# Patient Record
Sex: Female | Born: 1965 | Race: White | Hispanic: No | Marital: Married | Smoking: Never smoker
Health system: Southern US, Community
[De-identification: ages and names within clinical notes are randomized; demographics above are authoritative.]

## PROBLEM LIST (undated history)

## (undated) DIAGNOSIS — F988 Other specified behavioral and emotional disorders with onset usually occurring in childhood and adolescence: Secondary | ICD-10-CM

## (undated) HISTORY — DX: Other specified behavioral and emotional disorders with onset usually occurring in childhood and adolescence: F98.8

## (undated) HISTORY — PX: KNEE SURGERY: SHX244

---

## 1997-09-18 ENCOUNTER — Encounter (HOSPITAL_COMMUNITY): Admission: RE | Admit: 1997-09-18 | Discharge: 1997-11-11 | Payer: Self-pay | Admitting: *Deleted

## 1997-10-08 ENCOUNTER — Ambulatory Visit (HOSPITAL_COMMUNITY): Admission: RE | Admit: 1997-10-08 | Discharge: 1997-10-08 | Payer: Self-pay | Admitting: *Deleted

## 1997-10-21 ENCOUNTER — Observation Stay (HOSPITAL_COMMUNITY): Admission: AD | Admit: 1997-10-21 | Discharge: 1997-10-22 | Payer: Self-pay | Admitting: *Deleted

## 1997-11-10 ENCOUNTER — Inpatient Hospital Stay (HOSPITAL_COMMUNITY): Admission: AD | Admit: 1997-11-10 | Discharge: 1997-11-12 | Payer: Self-pay | Admitting: *Deleted

## 1998-01-14 ENCOUNTER — Inpatient Hospital Stay (HOSPITAL_COMMUNITY): Admission: RE | Admit: 1998-01-14 | Discharge: 1998-01-14 | Payer: Self-pay | Admitting: *Deleted

## 1998-01-17 ENCOUNTER — Encounter: Admission: RE | Admit: 1998-01-17 | Discharge: 1998-04-17 | Payer: Self-pay | Admitting: *Deleted

## 1999-03-24 ENCOUNTER — Inpatient Hospital Stay (HOSPITAL_COMMUNITY): Admission: AD | Admit: 1999-03-24 | Discharge: 1999-03-24 | Payer: Self-pay | Admitting: *Deleted

## 2000-07-05 ENCOUNTER — Inpatient Hospital Stay (HOSPITAL_COMMUNITY): Admission: AD | Admit: 2000-07-05 | Discharge: 2000-07-05 | Payer: Self-pay | Admitting: *Deleted

## 2000-07-19 ENCOUNTER — Inpatient Hospital Stay (HOSPITAL_COMMUNITY): Admission: RE | Admit: 2000-07-19 | Discharge: 2000-07-19 | Payer: Self-pay | Admitting: *Deleted

## 2000-07-19 ENCOUNTER — Encounter: Payer: Self-pay | Admitting: *Deleted

## 2000-07-29 ENCOUNTER — Encounter (HOSPITAL_COMMUNITY): Admission: RE | Admit: 2000-07-29 | Discharge: 2000-10-27 | Payer: Self-pay | Admitting: *Deleted

## 2000-08-05 ENCOUNTER — Observation Stay (HOSPITAL_COMMUNITY): Admission: RE | Admit: 2000-08-05 | Discharge: 2000-08-06 | Payer: Self-pay | Admitting: *Deleted

## 2000-10-04 ENCOUNTER — Ambulatory Visit (HOSPITAL_COMMUNITY): Admission: RE | Admit: 2000-10-04 | Discharge: 2000-10-04 | Payer: Self-pay | Admitting: *Deleted

## 2000-10-04 ENCOUNTER — Encounter: Payer: Self-pay | Admitting: *Deleted

## 2000-10-10 ENCOUNTER — Ambulatory Visit (HOSPITAL_COMMUNITY): Admission: RE | Admit: 2000-10-10 | Discharge: 2000-10-10 | Payer: Self-pay | Admitting: *Deleted

## 2000-11-01 ENCOUNTER — Encounter (HOSPITAL_COMMUNITY): Admission: RE | Admit: 2000-11-01 | Discharge: 2000-12-14 | Payer: Self-pay | Admitting: *Deleted

## 2000-11-07 ENCOUNTER — Encounter: Payer: Self-pay | Admitting: *Deleted

## 2000-12-13 ENCOUNTER — Encounter: Payer: Self-pay | Admitting: *Deleted

## 2000-12-25 ENCOUNTER — Inpatient Hospital Stay (HOSPITAL_COMMUNITY): Admission: AD | Admit: 2000-12-25 | Discharge: 2000-12-25 | Payer: Self-pay | Admitting: Obstetrics & Gynecology

## 2000-12-26 ENCOUNTER — Inpatient Hospital Stay (HOSPITAL_COMMUNITY): Admission: AD | Admit: 2000-12-26 | Discharge: 2000-12-26 | Payer: Self-pay | Admitting: Obstetrics & Gynecology

## 2001-01-03 ENCOUNTER — Encounter: Payer: Self-pay | Admitting: *Deleted

## 2001-01-03 ENCOUNTER — Encounter (HOSPITAL_COMMUNITY): Admission: RE | Admit: 2001-01-03 | Discharge: 2001-02-02 | Payer: Self-pay | Admitting: Obstetrics and Gynecology

## 2001-01-21 ENCOUNTER — Inpatient Hospital Stay (HOSPITAL_COMMUNITY): Admission: AD | Admit: 2001-01-21 | Discharge: 2001-01-21 | Payer: Self-pay | Admitting: Obstetrics

## 2001-01-24 ENCOUNTER — Encounter: Payer: Self-pay | Admitting: *Deleted

## 2001-01-24 ENCOUNTER — Inpatient Hospital Stay (HOSPITAL_COMMUNITY): Admission: AD | Admit: 2001-01-24 | Discharge: 2001-01-27 | Payer: Self-pay | Admitting: *Deleted

## 2001-02-07 ENCOUNTER — Encounter (HOSPITAL_COMMUNITY): Admission: RE | Admit: 2001-02-07 | Discharge: 2001-02-27 | Payer: Self-pay | Admitting: *Deleted

## 2001-02-07 ENCOUNTER — Encounter: Payer: Self-pay | Admitting: *Deleted

## 2001-02-13 ENCOUNTER — Observation Stay (HOSPITAL_COMMUNITY): Admission: AD | Admit: 2001-02-13 | Discharge: 2001-02-13 | Payer: Self-pay | Admitting: Obstetrics

## 2001-02-24 ENCOUNTER — Inpatient Hospital Stay (HOSPITAL_COMMUNITY): Admission: RE | Admit: 2001-02-24 | Discharge: 2001-02-26 | Payer: Self-pay | Admitting: *Deleted

## 2001-02-24 ENCOUNTER — Encounter: Payer: Self-pay | Admitting: *Deleted

## 2001-02-28 ENCOUNTER — Inpatient Hospital Stay (HOSPITAL_COMMUNITY): Admission: AD | Admit: 2001-02-28 | Discharge: 2001-02-28 | Payer: Self-pay | Admitting: Obstetrics

## 2001-03-07 ENCOUNTER — Inpatient Hospital Stay (HOSPITAL_COMMUNITY): Admission: AD | Admit: 2001-03-07 | Discharge: 2001-03-07 | Payer: Self-pay | Admitting: *Deleted

## 2002-03-06 ENCOUNTER — Inpatient Hospital Stay (HOSPITAL_COMMUNITY): Admission: AD | Admit: 2002-03-06 | Discharge: 2002-03-06 | Payer: Self-pay | Admitting: *Deleted

## 2002-06-01 ENCOUNTER — Ambulatory Visit (HOSPITAL_COMMUNITY): Admission: RE | Admit: 2002-06-01 | Discharge: 2002-06-01 | Payer: Self-pay | Admitting: *Deleted

## 2002-06-01 ENCOUNTER — Encounter: Payer: Self-pay | Admitting: *Deleted

## 2003-06-18 ENCOUNTER — Ambulatory Visit (HOSPITAL_COMMUNITY): Admission: RE | Admit: 2003-06-18 | Discharge: 2003-06-18 | Payer: Self-pay | Admitting: *Deleted

## 2003-06-18 ENCOUNTER — Encounter: Admission: RE | Admit: 2003-06-18 | Discharge: 2003-06-18 | Payer: Self-pay | Admitting: *Deleted

## 2003-06-27 ENCOUNTER — Encounter: Admission: RE | Admit: 2003-06-27 | Discharge: 2003-06-27 | Payer: Self-pay | Admitting: *Deleted

## 2005-12-10 ENCOUNTER — Ambulatory Visit: Payer: Self-pay | Admitting: Internal Medicine

## 2006-04-29 ENCOUNTER — Ambulatory Visit: Payer: Self-pay | Admitting: Internal Medicine

## 2010-12-28 ENCOUNTER — Ambulatory Visit: Payer: Self-pay | Admitting: Internal Medicine

## 2011-01-06 ENCOUNTER — Ambulatory Visit: Payer: Self-pay | Admitting: Internal Medicine

## 2012-08-02 ENCOUNTER — Ambulatory Visit: Payer: Self-pay | Admitting: Podiatry

## 2012-11-07 ENCOUNTER — Ambulatory Visit: Payer: Self-pay | Admitting: Internal Medicine

## 2013-12-22 ENCOUNTER — Emergency Department: Payer: Self-pay | Admitting: Emergency Medicine

## 2013-12-22 LAB — COMPREHENSIVE METABOLIC PANEL
Albumin: 3.7 g/dL (ref 3.4–5.0)
Alkaline Phosphatase: 71 U/L
Anion Gap: 5 — ABNORMAL LOW (ref 7–16)
BUN: 14 mg/dL (ref 7–18)
Bilirubin,Total: 0.3 mg/dL (ref 0.2–1.0)
Calcium, Total: 8.5 mg/dL (ref 8.5–10.1)
Chloride: 105 mmol/L (ref 98–107)
Co2: 28 mmol/L (ref 21–32)
Creatinine: 0.71 mg/dL (ref 0.60–1.30)
EGFR (African American): >60
EGFR (Non-African Amer.): >60
Glucose: 107 mg/dL — ABNORMAL HIGH (ref 65–99)
Osmolality: 277 (ref 275–301)
Potassium: 4 mmol/L (ref 3.5–5.1)
SGOT(AST): 13 U/L — ABNORMAL LOW (ref 15–37)
SGPT (ALT): 21 U/L (ref 12–78)
Sodium: 138 mmol/L (ref 136–145)
Total Protein: 7.3 g/dL (ref 6.4–8.2)

## 2013-12-22 LAB — URINALYSIS, COMPLETE: Specific Gravity: 1.023 (ref 1.003–1.030)

## 2013-12-22 LAB — CBC
HCT: 38.7 % (ref 35.0–47.0)
HGB: 12.7 g/dL (ref 12.0–16.0)
MCH: 29.5 pg (ref 26.0–34.0)
MCHC: 32.7 g/dL (ref 32.0–36.0)
MCV: 90 fL (ref 80–100)
Platelet: 314 10*3/uL (ref 150–440)
RBC: 4.29 10*6/uL (ref 3.80–5.20)
RDW: 13.6 % (ref 11.5–14.5)
WBC: 8.9 10*3/uL (ref 3.6–11.0)

## 2013-12-22 LAB — PREGNANCY, URINE: Pregnancy Test, Urine: NEGATIVE m[IU]/mL

## 2013-12-26 DIAGNOSIS — F909 Attention-deficit hyperactivity disorder, unspecified type: Secondary | ICD-10-CM | POA: Insufficient documentation

## 2013-12-28 ENCOUNTER — Ambulatory Visit: Payer: Self-pay | Admitting: Internal Medicine

## 2015-03-12 ENCOUNTER — Emergency Department (HOSPITAL_COMMUNITY)
Admission: EM | Admit: 2015-03-12 | Discharge: 2015-03-12 | Disposition: A | Discharge status: Home / Self Care | Payer: 59 | Attending: Emergency Medicine | Admitting: Emergency Medicine

## 2015-03-12 ENCOUNTER — Encounter (HOSPITAL_COMMUNITY): Payer: Self-pay

## 2015-03-12 DIAGNOSIS — L02811 Cutaneous abscess of head [any part, except face]: Secondary | ICD-10-CM | POA: Diagnosis not present

## 2015-03-12 DIAGNOSIS — R220 Localized swelling, mass and lump, head: Secondary | ICD-10-CM | POA: Diagnosis present

## 2015-03-12 DIAGNOSIS — L0291 Cutaneous abscess, unspecified: Secondary | ICD-10-CM

## 2015-03-12 MED ORDER — CEPHALEXIN 500 MG PO CAPS: 500.0000 mg | ORAL_CAPSULE | Freq: Four times a day (QID) | ORAL | Status: DC

## 2015-03-12 MED ORDER — LIDOCAINE HCL (PF) 1 % IJ SOLN
2.0000 mL | Freq: Once | INTRAMUSCULAR | Status: DC
  Filled 2015-03-12: qty 5

## 2015-03-12 MED ORDER — IBUPROFEN 200 MG PO TABS
600.0000 mg | ORAL_TABLET | Freq: Once | ORAL | Status: AC
  Administered 2015-03-12: 600 mg via ORAL
  Filled 2015-03-12: qty 3

## 2015-03-12 NOTE — ED Notes (Signed)
Patient reports she noticed a pimple-like bump on the back of her head a few days ago.  Area is now much larger  (nickel sized) and painful.

## 2015-03-12 NOTE — ED Provider Notes (Signed)
CSN: [id number]     Arrival date & time 03/12/15  1191 History  This chart was scribed for Earley Favor, NP working with Blake Divine, MD by Placido Sou, ED Scribe. This patient was seen in room WTR9/WTR9 and the patient's care was started at 8:55 PM.   Chief Complaint  Patient presents with  . Abscess   The history is provided by the patient. No language interpreter was used.    HPI Comments: Gabriela Norris is a 49 y.o. female who presents to the Emergency Department complaining of a worsening point of swelling to the posterior aspect of her head with onset a few days ago. Pt notes that the swelling has began to worsen, has applied hot compresses and soaked in a warm bath with no relief and further notes associated pain from the affected area that is worsened when lying supine. Pt denies taking any medications for pain relief or any known drainage from the affected area. Pt denies any drainage from the affected area.   History reviewed. No pertinent past medical history. Past Surgical History  Procedure Laterality Date  . Knee surgery    . Cesarean section     No family history on file. History  Substance Use Topics  . Smoking status: Never Smoker   . Smokeless tobacco: Not on file  . Alcohol Use: No   OB History    No data available     Review of Systems  Skin: Positive for color change and wound.  Neurological: Negative for dizziness and headaches.  All other systems reviewed and are negative.   Allergies  Review of patient's allergies indicates not on file.  Home Medications   Prior to Admission medications   Medication Sig Start Date End Date Taking? Authorizing Provider  cephALEXin (KEFLEX) 500 MG capsule Take 1 capsule (500 mg total) by mouth 4 (four) times daily. 03/12/15   Earley Favor, NP   BP 127/77 mmHg  Pulse 93  Temp(Src) 98.9 F (37.2 C) (Oral)  Resp 18  SpO2 100%  LMP 03/05/2015 (Approximate) Physical Exam  Constitutional: She is oriented to  person, place, and time. She appears well-developed and well-nourished. No distress.  HENT:  Head: Normocephalic and atraumatic.  Mouth/Throat: Oropharynx is clear and moist.  Eyes: Conjunctivae and EOM are normal. Pupils are equal, round, and reactive to light.  Neck: Normal range of motion. Neck supple. No tracheal deviation present.  Cardiovascular: Normal rate.   Pulmonary/Chest: Breath sounds normal. No respiratory distress.  Abdominal: Soft.  Musculoskeletal: Normal range of motion.  Neurological: She is alert and oriented to person, place, and time.  Skin: Skin is warm and dry.  1/2 cm round non fluctuant, firm, mass in the posterior midline scalp with a central scab area; no drainage  Psychiatric: She has a normal mood and affect. Her behavior is normal.  Nursing note and vitals reviewed.   ED Course  Procedures  DIAGNOSTIC STUDIES: Oxygen Saturation is 100% on RA, normal by my interpretation.    COORDINATION OF CARE: 8:56 PM Discussed treatment plan with pt at bedside and pt agreed to plan.  9:19 PM Pt determined that she would prefer to be discharged with just abx and no longer requests an I&D.   Labs Review Labs Reviewed - No data to display  Imaging Review No results found.   EKG Interpretation None     Initially I recommended antibiotic and warm compresses.  Because there is no fluctuance and I felt that an incision  and drainage at this time would be an unnecessary procedure but patient stated she would like me to try draining it,   shortly thereafter she change her mind and agreed with the original plan of antibiotic and warm compresses MDM   Final diagnoses:  Abscess    I personally performed the services described in this documentation, which was scribed in my presence. The recorded information has been reviewed and is accurate.   Earley Favor, NP 03/12/15 4098  Blake Divine, MD 03/12/15 2351

## 2015-03-12 NOTE — Discharge Instructions (Signed)
Abscess An abscess (boil or furuncle) is an infected area on or under the skin. This area is filled with yellowish-white fluid (pus) and other material (debris). HOME CARE   Only take medicines as told by your doctor.  If you were given antibiotic medicine, take it as directed. Finish the medicine even if you start to feel better.  If gauze is used, follow your doctor's directions for changing the gauze.  To avoid spreading the infection:  Keep your abscess covered with a bandage.  Wash your hands well.  Do not share personal care items, towels, or whirlpools with others.  Avoid skin contact with others.  Keep your skin and clothes clean around the abscess.  Keep all doctor visits as told. GET HELP RIGHT AWAY IF:   You have more pain, puffiness (swelling), or redness in the wound site.  You have more fluid or blood coming from the wound site.  You have muscle aches, chills, or you feel sick.  You have a fever. MAKE SURE YOU:   Understand these instructions.  Will watch your condition.  Will get help right away if you are not doing well or get worse. Document Released: 01/19/2008 Document Revised: 02/01/2012 Document Reviewed: 10/15/2011 [facility] Patient Information 2015 [place], [place]. This information is not intended to replace advice given to you by your health care provider. Make sure you discuss any questions you have with your health care provider. U been started on antiacid.  Please take this as directed.  You can safely use Tylenol or Advil for discomfort.  Please apply a warm compress for 5 times a day to the area to help it consolidate if it becomes fluctuant or fluid-filled.  Please return for Incision and drainage.  At this time.  It is firm without any fluctuance  at this time an incision and drainage would not be warranted

## 2015-08-12 ENCOUNTER — Other Ambulatory Visit: Payer: Self-pay | Admitting: Internal Medicine

## 2015-08-12 DIAGNOSIS — Z1231 Encounter for screening mammogram for malignant neoplasm of breast: Secondary | ICD-10-CM

## 2015-09-02 DIAGNOSIS — M2550 Pain in unspecified joint: Secondary | ICD-10-CM | POA: Insufficient documentation

## 2015-09-02 DIAGNOSIS — I7300 Raynaud's syndrome without gangrene: Secondary | ICD-10-CM | POA: Insufficient documentation

## 2015-09-02 DIAGNOSIS — R768 Other specified abnormal immunological findings in serum: Secondary | ICD-10-CM | POA: Insufficient documentation

## 2015-09-02 DIAGNOSIS — R5382 Chronic fatigue, unspecified: Secondary | ICD-10-CM | POA: Insufficient documentation

## 2015-09-24 ENCOUNTER — Encounter: Payer: Self-pay | Admitting: Obstetrics and Gynecology

## 2015-10-28 ENCOUNTER — Encounter: Payer: Self-pay | Admitting: Obstetrics and Gynecology

## 2015-12-03 DIAGNOSIS — G8929 Other chronic pain: Secondary | ICD-10-CM | POA: Insufficient documentation

## 2015-12-04 ENCOUNTER — Other Ambulatory Visit: Payer: Self-pay | Admitting: Internal Medicine

## 2015-12-04 ENCOUNTER — Encounter: Payer: Self-pay | Admitting: Obstetrics and Gynecology

## 2015-12-04 ENCOUNTER — Ambulatory Visit (INDEPENDENT_AMBULATORY_CARE_PROVIDER_SITE_OTHER): Payer: 59 | Admitting: Obstetrics and Gynecology

## 2015-12-04 VITALS — BP 121/80 | HR 102 | Ht 64.5 in | Wt 214.0 lb

## 2015-12-04 DIAGNOSIS — Z1211 Encounter for screening for malignant neoplasm of colon: Secondary | ICD-10-CM | POA: Diagnosis not present

## 2015-12-04 DIAGNOSIS — Z124 Encounter for screening for malignant neoplasm of cervix: Secondary | ICD-10-CM | POA: Diagnosis not present

## 2015-12-04 DIAGNOSIS — N926 Irregular menstruation, unspecified: Secondary | ICD-10-CM | POA: Diagnosis not present

## 2015-12-04 DIAGNOSIS — N951 Menopausal and female climacteric states: Secondary | ICD-10-CM

## 2015-12-04 DIAGNOSIS — Z01419 Encounter for gynecological examination (general) (routine) without abnormal findings: Secondary | ICD-10-CM

## 2015-12-04 DIAGNOSIS — G8929 Other chronic pain: Secondary | ICD-10-CM

## 2015-12-04 DIAGNOSIS — E669 Obesity, unspecified: Secondary | ICD-10-CM | POA: Diagnosis not present

## 2015-12-04 DIAGNOSIS — Z789 Other specified health status: Secondary | ICD-10-CM

## 2015-12-04 DIAGNOSIS — R6889 Other general symptoms and signs: Secondary | ICD-10-CM

## 2015-12-04 DIAGNOSIS — M79642 Pain in left hand: Principal | ICD-10-CM

## 2015-12-05 LAB — PROGESTERONE: Progesterone: <0.1 ng/mL

## 2015-12-05 LAB — FSH/LH
FSH: 5.3 m[IU]/mL
LH: 6.2 m[IU]/mL

## 2015-12-05 LAB — ESTRADIOL: Estradiol: 156.3 pg/mL

## 2015-12-05 LAB — TSH: TSH: 3.71 u[IU]/mL (ref 0.450–4.500)

## 2015-12-07 NOTE — Progress Notes (Signed)
GYNECOLOGY ANNUAL PHYSICAL EXAM PROGRESS NOTE  Subjective:    Gabriela Norris is a 50 y.o. [phone] female who presents to establish care, and for an annual exam. The patient is sexually active.  The patient wears seatbelts: yes. The patient participates in regular exercise: no. Has the patient ever been transfused or tattooed?: no. The patient reports that there is not domestic violence in her life.   The patient has the following complaints today:  1. Reports missed menses x 1 month 2. Desires referral to Endocrinology.  Patient notes that she has had "a lot of problems" lately, and thinks it may be gland related.  Also notes weight gain despite prior efforts of exercising and monitoring diet.   Gynecologic History Patient's last menstrual period was 10/24/2015. Menarche age: 59 Contraception: vasectomy History of STI's:  Last Pap: over 5 years ago. Results were: normal.  Denies h/o abnormal pap smears. Last mammogram: over 5 years ago. Results were: normal   Obstetric History   G7   P0   T0   P0   A2   TAB1   SAB0   E0   M0   L5     # Outcome Date GA Lbr Len/2nd Weight Sex Delivery Anes PTL Lv  7 [place]      Vag-Spont   Y  6 Gravida      Vag-Spont   Y  5 Gravida      CS-Unspec   Y  4 Gravida      CS-Unspec   Y  3 Gravida      CS-Unspec   Y  2 TAB           1 AB               Past Medical History  Diagnosis Date  . ADD (attention deficit disorder)     Past Surgical History  Procedure Laterality Date  . Knee surgery    . Cesarean section      Family History  Problem Relation Age of Onset  . Breast cancer Maternal Aunt     Social History   Social History  . Marital Status: Married    Spouse Name: N/A  . Number of Children: N/A  . Years of Education: N/A   Occupational History  . Not on file.   Social History Main Topics  . Smoking status: Never Smoker   . Smokeless tobacco: Not on file  . Alcohol Use: No  . Drug Use: No  . Sexual Activity:  Yes    Birth Control/ Protection: None, Surgical     Comment: Partner had vasectomy    Other Topics Concern  . Not on file   Social History Narrative    No current outpatient prescriptions on file prior to visit.   No current facility-administered medications on file prior to visit.    Allergies  Allergen Reactions  . Oxycodone-Acetaminophen Nausea And Vomiting    Review of Systems Constitutional: negative for chills, fatigue, fevers and sweats Eyes: negative for irritation, redness and visual disturbance Ears, nose, mouth, throat, and face: negative for hearing loss, nasal congestion, snoring and tinnitus Respiratory: negative for asthma, cough, sputum Cardiovascular: negative for chest pain, dyspnea, exertional chest pressure/discomfort, irregular heart beat, palpitations and syncope Gastrointestinal: negative for abdominal pain, change in bowel habits, nausea and vomiting Genitourinary: negative for abnormal menstrual periods, genital lesions, sexual problems and vaginal discharge, dysuria and urinary incontinence Integument/breast: negative for breast lump, breast tenderness and  nipple discharge Hematologic/lymphatic: negative for bleeding and easy bruising Musculoskeletal:negative for back pain and muscle weakness Neurological: negative for dizziness, headaches, vertigo and weakness Endocrine: negative for diabetic symptoms including polydipsia, polyuria and skin dryness Allergic/Immunologic: negative for hay fever and urticaria      Objective:  Blood pressure 121/80, pulse 102, height 5' 4.5" (1.638 m), weight 214 lb (97.07 kg), last menstrual period 10/24/2015. Body mass index is 36.18 kg/(m^2).    General Appearance:    Alert, cooperative, no distress, appears stated age, moderate obesity  Head:    Normocephalic, without obvious abnormality, atraumatic  Eyes:    PERRL, conjunctiva/corneas clear, EOM's intact, both eyes  Ears:    Normal external ear canals, both ears   Nose:   Nares normal, septum midline, mucosa normal, no drainage or sinus tenderness  Throat:   Lips, mucosa, and tongue normal; teeth and gums normal  Neck:   Supple, symmetrical, trachea midline, no adenopathy; thyroid: no enlargement/tenderness/nodules; no carotid bruit or JVD  Back:     Symmetric, no curvature, ROM normal, no CVA tenderness  Lungs:     Clear to auscultation bilaterally, respirations unlabored  Chest Wall:    No tenderness or deformity   Heart:    Regular rate and rhythm, S1 and S2 normal, no murmur, rub or gallop  Breast Exam:    No tenderness, masses, or nipple abnormality  Abdomen:     Soft, non-tender, bowel sounds active all four quadrants, no masses, no organomegaly.    Genitalia:    Pelvic:external genitalia normal, vagina without lesions, discharge, or tenderness, rectovaginal septum  normal. Cervix normal in appearance, no cervical motion tenderness, no adnexal masses or tenderness.  Uterus normal size, shape, mobile, regular contours, nontender.  Rectal:    Normal external sphincter.  No hemorrhoids appreciated. Internal exam not done.   Extremities:   Extremities normal, atraumatic, no cyanosis or edema  Pulses:   2+ and symmetric all extremities  Skin:   Skin color, texture, turgor normal, no rashes or lesions  Lymph nodes:   Cervical, supraclavicular, and axillary nodes normal  Neurologic:   CNII-XII intact, normal strength, sensation and reflexes throughout   .  Labs:  Lab Results  Component Value Date   WBC 8.9 12/22/2013   HGB 12.7 12/22/2013   HCT 38.7 12/22/2013   MCV 90 12/22/2013   PLT 314 12/22/2013    Lab Results  Component Value Date   CREATININE 0.71 12/22/2013   BUN 14 12/22/2013   NA 138 12/22/2013   K 4.0 12/22/2013   CL 105 12/22/2013   CO2 28 12/22/2013    Lab Results  Component Value Date   ALT 21 12/22/2013   AST 13* 12/22/2013   ALKPHOS 71 12/22/2013   BILITOT 0.3 12/22/2013     Assessment:   Routine gynecologic  exam.   Obesity, Class II Missed menses Perimenopausal status   Plan:     Blood tests: TSH, Estradiol, FSH, LH and Progesterone level.  Has routine annual labs to be scheduled with PCP.  Breast self exam technique reviewed and patient encouraged to perform self-exam monthly. Contraception: vasectomy. Patient likely perimenopausal.  Diagnosis explained in detail, including differential.  Is likely cause of missed menses. UPT performed today, negative.  Discussed healthy lifestyle modifications. Pap smear performed today.  Mammogram. Referral to Endocrinology for "gland problems" (patient did not desire to discuss, stating that it was very extensive), and GI for screening colonoscopy.   Follow up in 1 year.  Hildred LaserAnika Aleese Kamps, MD [facility]

## 2015-12-08 ENCOUNTER — Telehealth: Payer: Self-pay

## 2015-12-08 LAB — PAP IG AND HPV HIGH-RISK
HPV, high-risk: NEGATIVE
PAP Smear Comment: 0

## 2015-12-08 NOTE — Telephone Encounter (Signed)
-----   Message from Hildred LaserAnika Cherry, MD sent at 12/08/2015 11:37 AM EDT ----- Awaiting pap smear results, but if patient calls prior to this returning, please inform of normal hormone levels except that her progesterone is very low.  So she may be in the very initial stages of transitioning to menopause (likely perimenopausal at this point).

## 2015-12-08 NOTE — Telephone Encounter (Signed)
Called pt, no answer. LM for pt to call back.  

## 2015-12-09 NOTE — Telephone Encounter (Signed)
Pt calls informed her of the information below in addition to normal PAP results.

## 2015-12-16 ENCOUNTER — Ambulatory Visit: Payer: 59

## 2015-12-31 ENCOUNTER — Ambulatory Visit: Payer: 59

## 2016-04-29 DIAGNOSIS — N201 Calculus of ureter: Secondary | ICD-10-CM | POA: Insufficient documentation

## 2016-04-29 DIAGNOSIS — F909 Attention-deficit hyperactivity disorder, unspecified type: Secondary | ICD-10-CM | POA: Diagnosis not present

## 2016-04-29 DIAGNOSIS — Z79899 Other long term (current) drug therapy: Secondary | ICD-10-CM | POA: Diagnosis not present

## 2016-04-29 DIAGNOSIS — R109 Unspecified abdominal pain: Secondary | ICD-10-CM | POA: Diagnosis present

## 2016-04-29 NOTE — ED Triage Notes (Signed)
Pt to triage via w/c, appears uncomfortable, grimacing; reports few weeks ago had right flank pain that subsided; now having right lower abd pain with difficulty urinating; denies hx of same

## 2016-04-30 ENCOUNTER — Emergency Department
Admission: EM | Admit: 2016-04-30 | Discharge: 2016-04-30 | Disposition: A | Discharge status: Home / Self Care | Payer: 59 | Attending: Emergency Medicine | Admitting: Emergency Medicine

## 2016-04-30 ENCOUNTER — Emergency Department: Payer: 59

## 2016-04-30 DIAGNOSIS — N200 Calculus of kidney: Secondary | ICD-10-CM

## 2016-04-30 LAB — URINALYSIS COMPLETE WITH MICROSCOPIC (ARMC ONLY)
BILIRUBIN URINE: NEGATIVE
GLUCOSE, UA: 50 mg/dL — AB
Ketones, ur: NEGATIVE mg/dL
Leukocytes, UA: NEGATIVE
Nitrite: POSITIVE — AB
Protein, ur: NEGATIVE mg/dL
Specific Gravity, Urine: 1.026 (ref 1.005–1.030)
pH: 5 (ref 5.0–8.0)

## 2016-04-30 LAB — PREGNANCY, URINE: PREG TEST UR: NEGATIVE

## 2016-04-30 LAB — COMPREHENSIVE METABOLIC PANEL
ALK PHOS: 68 U/L (ref 38–126)
ALT: 18 U/L (ref 14–54)
ANION GAP: 10 (ref 5–15)
AST: 24 U/L (ref 15–41)
Albumin: 4.2 g/dL (ref 3.5–5.0)
BUN: 20 mg/dL (ref 6–20)
CO2: 22 mmol/L (ref 22–32)
Calcium: 9.6 mg/dL (ref 8.9–10.3)
Chloride: 107 mmol/L (ref 101–111)
Creatinine, Ser: 0.96 mg/dL (ref 0.44–1.00)
GFR calc non Af Amer: >60 mL/min (ref >60)
Glucose, Bld: 176 mg/dL — ABNORMAL HIGH (ref 65–99)
Potassium: 3.9 mmol/L (ref 3.5–5.1)
Sodium: 139 mmol/L (ref 135–145)
Total Bilirubin: 0.5 mg/dL (ref 0.3–1.2)
Total Protein: 7.2 g/dL (ref 6.5–8.1)

## 2016-04-30 LAB — CBC
HCT: 38.6 % (ref 35.0–47.0)
Hemoglobin: 12.5 g/dL (ref 12.0–16.0)
MCH: 27.1 pg (ref 26.0–34.0)
MCHC: 32.4 g/dL (ref 32.0–36.0)
MCV: 83.6 fL (ref 80.0–100.0)
Platelets: 372 10*3/uL (ref 150–440)
RBC: 4.62 MIL/uL (ref 3.80–5.20)
RDW: 16 % — ABNORMAL HIGH (ref 11.5–14.5)
WBC: 13.8 10*3/uL — ABNORMAL HIGH (ref 3.6–11.0)

## 2016-04-30 LAB — POCT PREGNANCY, URINE: PREG TEST UR: NEGATIVE

## 2016-04-30 MED ORDER — HYDROMORPHONE HCL 1 MG/ML IJ SOLN
INTRAMUSCULAR | Status: AC
  Filled 2016-04-30: qty 1

## 2016-04-30 MED ORDER — ONDANSETRON HCL 4 MG/2ML IJ SOLN
INTRAMUSCULAR | Status: AC
  Administered 2016-04-30: 4 mg via INTRAVENOUS
  Filled 2016-04-30: qty 2

## 2016-04-30 MED ORDER — MORPHINE SULFATE (PF) 4 MG/ML IV SOLN
INTRAVENOUS | Status: AC
  Filled 2016-04-30: qty 1

## 2016-04-30 MED ORDER — MORPHINE SULFATE (PF) 4 MG/ML IV SOLN
INTRAVENOUS | Status: AC
  Administered 2016-04-30: 4 mg via INTRAVENOUS
  Filled 2016-04-30: qty 1

## 2016-04-30 MED ORDER — HYDROMORPHONE HCL 1 MG/ML IJ SOLN
1.0000 mg | Freq: Once | INTRAMUSCULAR | Status: AC
  Administered 2016-04-30: 1 mg via INTRAVENOUS

## 2016-04-30 MED ORDER — KETOROLAC TROMETHAMINE 30 MG/ML IJ SOLN
INTRAMUSCULAR | Status: AC
  Filled 2016-04-30: qty 1

## 2016-04-30 MED ORDER — ONDANSETRON 4 MG PO TBDP: 4.0000 mg | ORAL_TABLET | Freq: Three times a day (TID) | ORAL | 0 refills | Status: AC | PRN

## 2016-04-30 MED ORDER — ONDANSETRON HCL 4 MG/2ML IJ SOLN
4.0000 mg | Freq: Once | INTRAMUSCULAR | Status: AC
  Administered 2016-04-30: 4 mg via INTRAVENOUS

## 2016-04-30 MED ORDER — ONDANSETRON HCL 4 MG/2ML IJ SOLN
INTRAMUSCULAR | Status: AC
  Filled 2016-04-30: qty 2

## 2016-04-30 MED ORDER — HYDROCODONE-ACETAMINOPHEN 5-325 MG PO TABS: 1.0000 | ORAL_TABLET | ORAL | 0 refills | Status: AC | PRN

## 2016-04-30 MED ORDER — MORPHINE SULFATE (PF) 4 MG/ML IV SOLN
4.0000 mg | Freq: Once | INTRAVENOUS | Status: AC
  Administered 2016-04-30: 4 mg via INTRAVENOUS

## 2016-04-30 MED ORDER — SODIUM CHLORIDE 0.9 % IV BOLUS (SEPSIS)
1000.0000 mL | Freq: Once | INTRAVENOUS | Status: AC
  Administered 2016-04-30: 1000 mL via INTRAVENOUS

## 2016-04-30 MED ORDER — KETOROLAC TROMETHAMINE 30 MG/ML IJ SOLN
30.0000 mg | Freq: Once | INTRAMUSCULAR | Status: AC
  Administered 2016-04-30: 30 mg via INTRAVENOUS

## 2016-04-30 NOTE — ED Notes (Signed)
Dr. Manson PasseyBrown at pt's bedside at this time.

## 2016-04-30 NOTE — ED Notes (Signed)
Patient transported to CT 

## 2016-04-30 NOTE — ED Provider Notes (Signed)
[facility] Emergency Department Provider Note    First MD Initiated Contact with Patient 04/30/16 0013     (approximate)  I have reviewed the triage vital signs and the nursing notes.   HISTORY  Chief Complaint Abdominal Pain    HPI Gabriela Norris is a 50 y.o. female presents in apparent distress with right flank pain with acute onset tonight. Patient also admits to nausea and difficulty urinating. Patient denies any previous history of kidney stones. Patient denies any history of appendicitis or ovarian cysts.   Past Medical History:  Diagnosis Date  . ADD (attention deficit disorder)     There are no active problems to display for this patient.   Past Surgical History:  Procedure Laterality Date  . CESAREAN SECTION    . KNEE SURGERY      Prior to Admission medications   Medication Sig Start Date End Date Taking? Authorizing Provider  Cholecalciferol (VITAMIN D-3 PO) Take by mouth.    Historical Provider, MD  Cyanocobalamin (RA VITAMIN B-12 TR) 1000 MCG TBCR Take by mouth.    Historical Provider, MD  diclofenac sodium (VOLTAREN) 1 % GEL  12/03/15   Historical Provider, MD  VYVANSE 70 MG capsule  11/13/15   Historical Provider, MD    Allergies Oxycodone-acetaminophen  Family History  Problem Relation Age of Onset  . Breast cancer Maternal Aunt     Social History Social History  Substance Use Topics  . Smoking status: Never Smoker  . Smokeless tobacco: Not on file  . Alcohol use No    Review of Systems Constitutional: No fever/chills Eyes: No visual changes. ENT: No sore throat. Cardiovascular: Denies chest pain. Respiratory: Denies shortness of breath. Gastrointestinal: No abdominal pain.  No nausea, no vomiting.  No diarrhea.  No constipation. Genitourinary: Negative for dysuria. Musculoskeletal: Positive for right flank pain Skin: Negative for rash. Neurological: Negative for headaches, focal weakness or  numbness.  10-point ROS otherwise negative.  ____________________________________________   PHYSICAL EXAM:  VITAL SIGNS: ED Triage Vitals  Enc Vitals Group     BP 04/30/16 0002 134/78     Pulse Rate 04/30/16 0002 76     Resp 04/30/16 0026 (!) 22     Temp 04/30/16 0002 97.5 F (36.4 C)     Temp Source 04/30/16 0002 Oral     SpO2 04/30/16 0002 99 %     Weight 04/29/16 2359 190 lb (86.2 kg)     Height 04/29/16 2359 5\' 4"  (1.626 m)     Head Circumference --      Peak Flow --      Pain Score 04/29/16 2359 10     Pain Loc --      Pain Edu? --      Excl. in GC? --     Constitutional: Alert and oriented. Apparent distress Eyes: Conjunctivae are normal. PERRL. EOMI. Head: Atraumatic. Mouth/Throat: Mucous membranes are moist.  Oropharynx non-erythematous. Neck: No stridor.  No meningeal signs. Cardiovascular: Normal rate, regular rhythm. Good peripheral circulation. Grossly normal heart sounds. Respiratory: Normal respiratory effort.  No retractions. Lungs CTAB. Gastrointestinal: Soft and nontender. No distention.  Musculoskeletal: No lower extremity tenderness nor edema. No gross deformities of extremities. Neurologic:  Normal speech and language. No gross focal neurologic deficits are appreciated.  Skin:  Skin is warm, dry and intact. No rash noted. Psychiatric: Mood and affect are normal. Speech and behavior are normal.  ____________________________________________   LABS (all labs ordered are listed, but  only abnormal results are displayed)  Labs Reviewed  CBC  COMPREHENSIVE METABOLIC PANEL  URINALYSIS COMPLETEWITH MICROSCOPIC ([facility] ONLY)        Procedures      INITIAL IMPRESSION / ASSESSMENT AND PLAN / ED COURSE  Pertinent labs & imaging results that were available during my care of the patient were reviewed by me and considered in my medical decision making (see chart for details).  Patient given 2 doses of IV morphine 4 mg without improvement of pain.  Patient then given 1 mg IV Dilantin and Toradol 30 mg after I viewed the patient's CT scan and laboratory data with resolution of pain.   Clinical Course    ____________________________________________  FINAL CLINICAL IMPRESSION(S) / ED DIAGNOSES  Final diagnoses:  Right kidney stone     MEDICATIONS GIVEN DURING THIS VISIT:  Medications  morphine 4 MG/ML injection (not administered)  ondansetron (ZOFRAN) 4 MG/2ML injection (not administered)     NEW OUTPATIENT MEDICATIONS STARTED DURING THIS VISIT:  New Prescriptions   No medications on file    Modified Medications   No medications on file    Discontinued Medications   No medications on file     Note:  This document was prepared using Dragon voice recognition software and may include unintentional dictation errors.    Darci Current, MD 04/30/16 [phone]

## 2016-04-30 NOTE — ED Notes (Signed)
Pt stating that about a week and a half ago she had some right flank pain. About 2 hours ago pt began with RLQ pain with difficulty urinating. Pt stating that she had frequent urination and that it was "nagging" feeling when she goes. Pt is crying and moving around the room at this time.

## 2016-12-07 ENCOUNTER — Encounter: Payer: 59 | Admitting: Obstetrics and Gynecology

## 2017-04-20 DIAGNOSIS — R7309 Other abnormal glucose: Secondary | ICD-10-CM | POA: Insufficient documentation

## 2017-04-20 DIAGNOSIS — E7800 Pure hypercholesterolemia, unspecified: Secondary | ICD-10-CM | POA: Insufficient documentation

## 2018-01-06 IMAGING — CT CT RENAL STONE PROTOCOL
2 of 4 series · 17 of 46 positions shown, 19 images · non-contrast
Comparison: None.

CLINICAL DATA: Right flank pain for 10 days. Sudden worsening 2
hours ago.

EXAM:
CT ABDOMEN AND PELVIS WITHOUT CONTRAST
TECHNIQUE: Multidetector CT imaging of the abdomen and pelvis was performed
following the standard protocol without IV contrast.

[Series 2: axial st · axial · 0.85mm/px · z∈[-868,-448]mm · 14 of 93 slices shown, 16 images]
[im 5/93  soft-tissue]
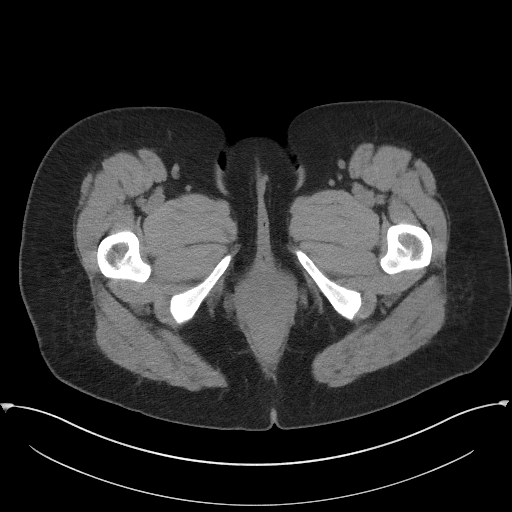
[im 5/93  bone]
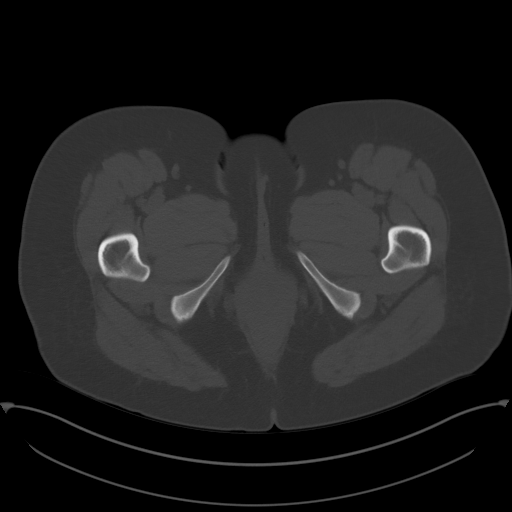
[im 13/93  soft-tissue]
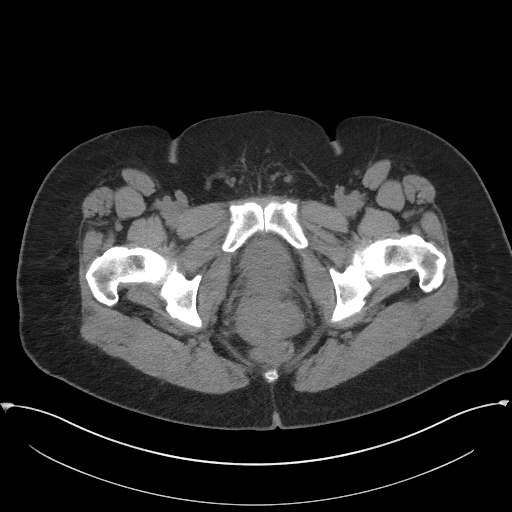
[im 17/93  soft-tissue]
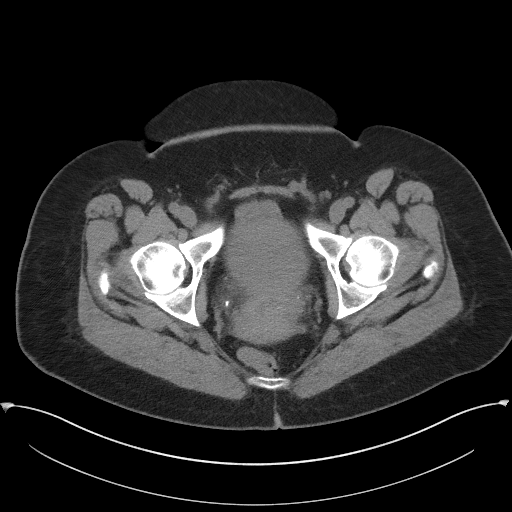
[im 25/93  soft-tissue]
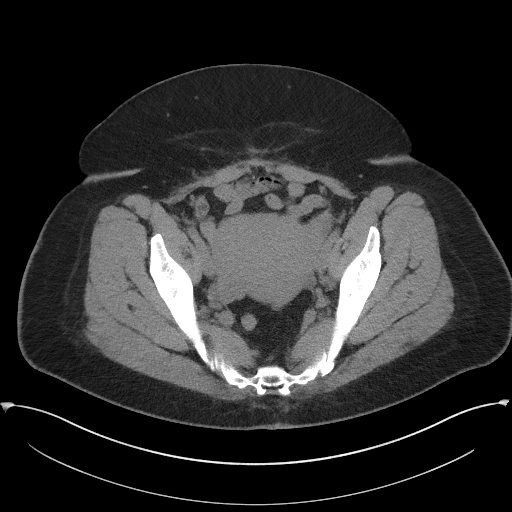
[im 33/93  soft-tissue]
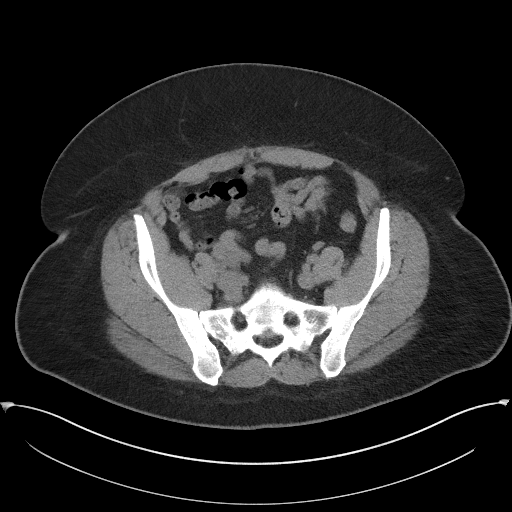
[im 37/93  soft-tissue]
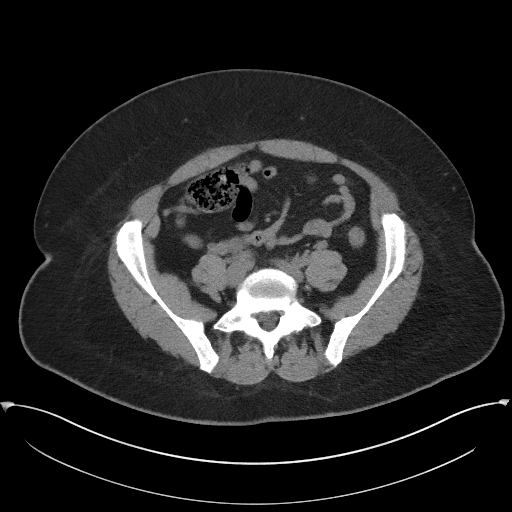
[im 45/93  soft-tissue]
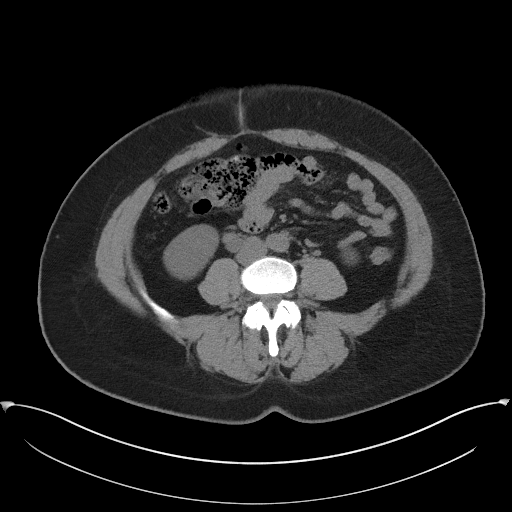
[im 49/93  soft-tissue]
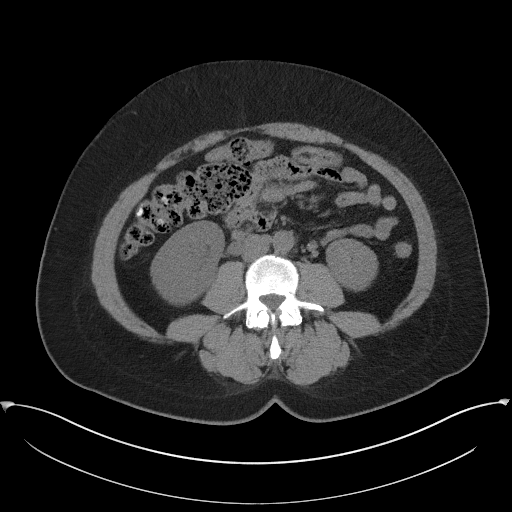
[im 57/93  soft-tissue]
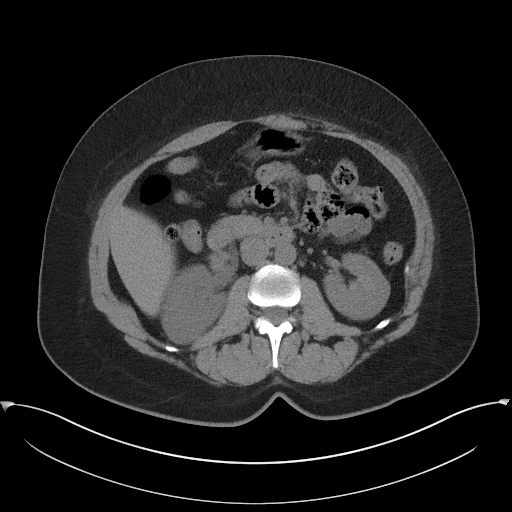
[im 57/93  bone]
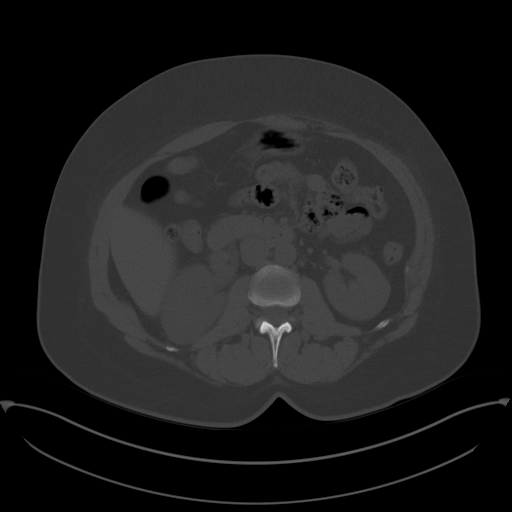
[im 61/93  soft-tissue]
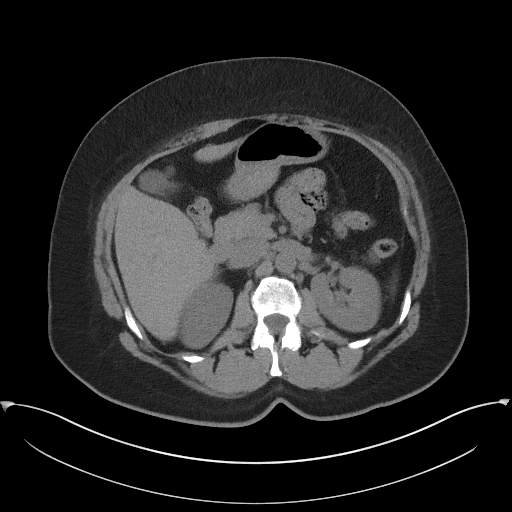
[im 69/93  soft-tissue]
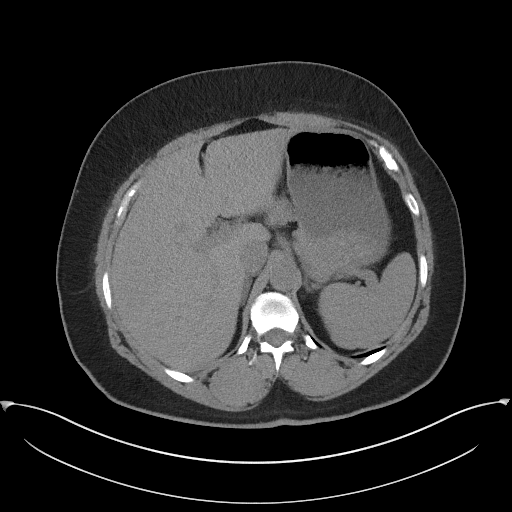
[im 77/93  soft-tissue]
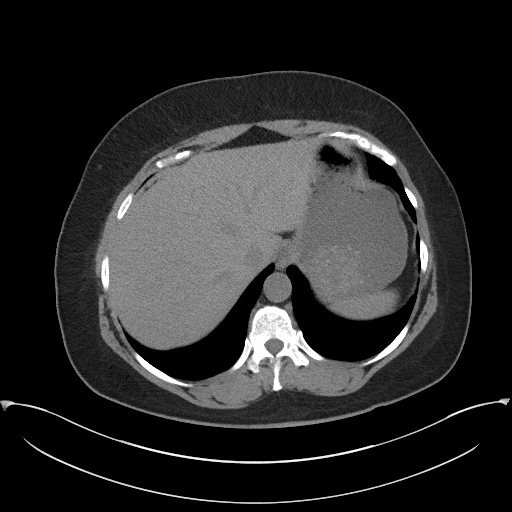
[im 81/93  soft-tissue]
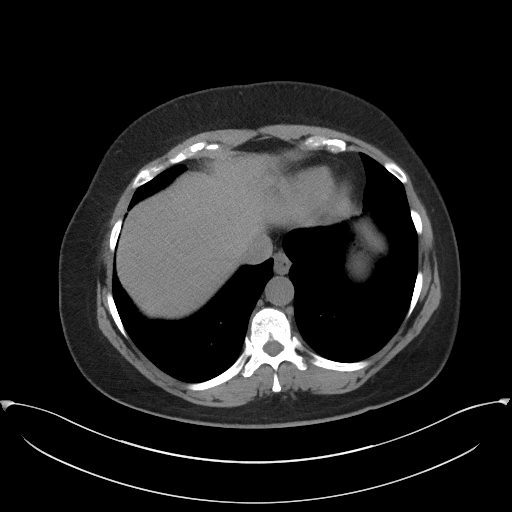
[im 89/93  soft-tissue]
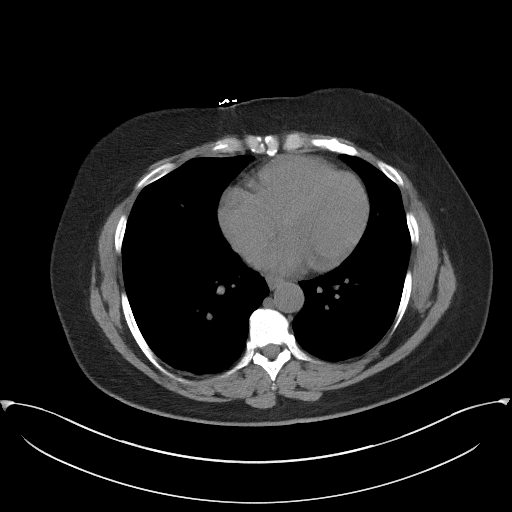

[Series 5: coronal · coronal · 0.86mm/px · 3 of 148 slices shown]
[im 50/148  soft-tissue]
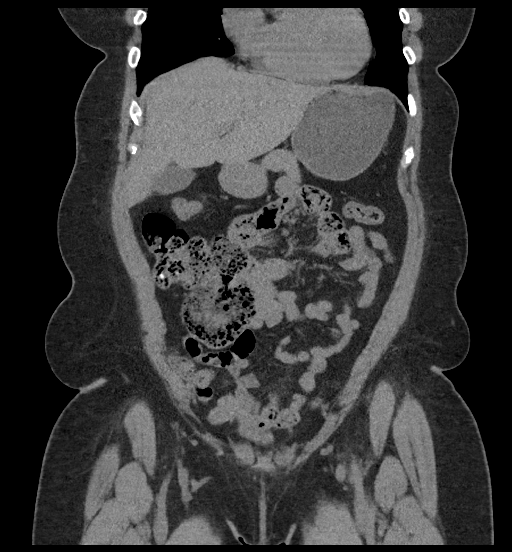
[im 66/148  soft-tissue]
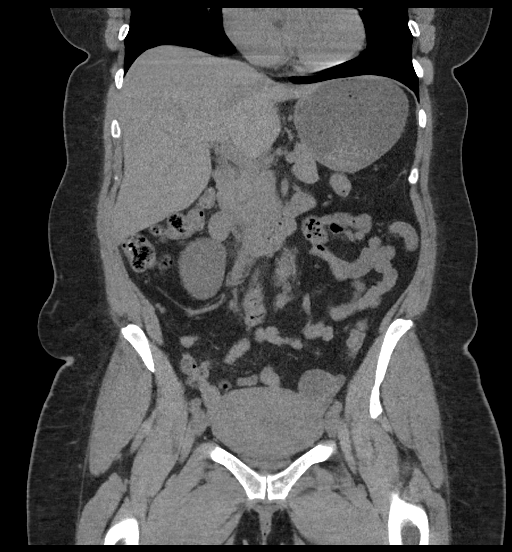
[im 82/148  soft-tissue]
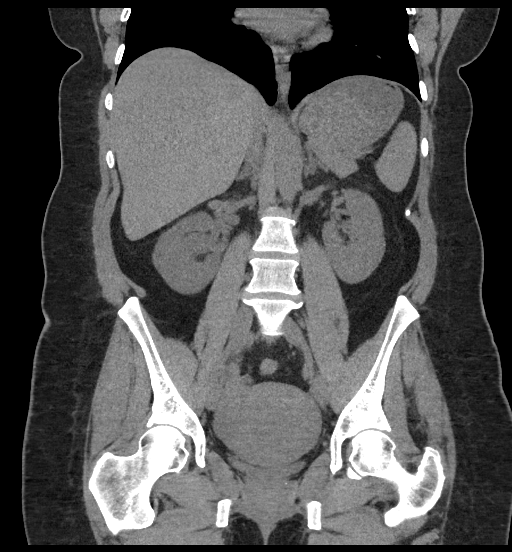

[17 of 46 positions shown; findings below may reference images not displayed]

FINDINGS: Lower chest: No significant abnormality

Hepatobiliary: There are normal appearances of the liver,
gallbladder and bile ducts.

Pancreas: Normal

Spleen: Normal

Adrenals/Urinary Tract: There is marked hydronephrosis and
hydroureter on the right. There is an obstructing 4 mm calculus of
the distal right ureter, 2 cm from the ureterovesical junction. No
other urinary calculi are evident.

No significant renal parenchymal abnormalities are evident. Urinary
bladder is unremarkable.

Both adrenals are normal.

Stomach/Bowel: There are normal appearances of the stomach, small
bowel and colon. The appendix is normal.

Vascular/Lymphatic: The abdominal aorta is normal in caliber. There
is no atherosclerotic calcification. There is no adenopathy in the
abdomen or pelvis.

Reproductive: Uterus is mildly enlarged with prominent contour
suggesting multiple fibroids, as observed on 12/18/2013 ultrasound.
No adnexal masses are evident. No free pelvic fluid.

Other: Small fat containing umbilical hernia.

Musculoskeletal: No significant skeletal lesions.
IMPRESSION: 1. Obstructing 4 mm distal right ureteral calculus, 2 cm from the
ureterovesical junction. Marked hydronephrosis.
2. Uterine enlargement, probably multi fibroid.
3. Small fat containing umbilical hernia.

## 2018-08-29 DIAGNOSIS — D649 Anemia, unspecified: Secondary | ICD-10-CM | POA: Insufficient documentation

## 2020-01-02 ENCOUNTER — Other Ambulatory Visit: Payer: Self-pay

## 2020-01-02 ENCOUNTER — Other Ambulatory Visit (HOSPITAL_COMMUNITY)
Admission: RE | Admit: 2020-01-02 | Discharge: 2020-01-02 | Disposition: A | Discharge status: Home / Self Care | Payer: PRIVATE HEALTH INSURANCE | Source: Ambulatory Visit | Attending: Obstetrics and Gynecology | Admitting: Obstetrics and Gynecology

## 2020-01-02 ENCOUNTER — Encounter: Payer: Self-pay | Admitting: Obstetrics and Gynecology

## 2020-01-02 ENCOUNTER — Ambulatory Visit: Payer: 59 | Admitting: Obstetrics and Gynecology

## 2020-01-02 VITALS — BP 133/84 | HR 80 | Ht 64.0 in | Wt 206.6 lb

## 2020-01-02 DIAGNOSIS — R5383 Other fatigue: Secondary | ICD-10-CM | POA: Diagnosis not present

## 2020-01-02 DIAGNOSIS — Z124 Encounter for screening for malignant neoplasm of cervix: Secondary | ICD-10-CM | POA: Insufficient documentation

## 2020-01-02 DIAGNOSIS — Z86018 Personal history of other benign neoplasm: Secondary | ICD-10-CM

## 2020-01-02 DIAGNOSIS — N924 Excessive bleeding in the premenopausal period: Secondary | ICD-10-CM | POA: Diagnosis not present

## 2020-01-02 DIAGNOSIS — R6889 Other general symptoms and signs: Secondary | ICD-10-CM | POA: Diagnosis not present

## 2020-01-02 DIAGNOSIS — Z13220 Encounter for screening for lipoid disorders: Secondary | ICD-10-CM

## 2020-01-02 DIAGNOSIS — N951 Menopausal and female climacteric states: Secondary | ICD-10-CM

## 2020-01-02 DIAGNOSIS — R638 Other symptoms and signs concerning food and fluid intake: Secondary | ICD-10-CM

## 2020-01-02 DIAGNOSIS — R928 Other abnormal and inconclusive findings on diagnostic imaging of breast: Secondary | ICD-10-CM

## 2020-01-02 DIAGNOSIS — Z8639 Personal history of other endocrine, nutritional and metabolic disease: Secondary | ICD-10-CM

## 2020-01-02 NOTE — Progress Notes (Signed)
Pt present for irregular cycles. Pt stated her cycle stated on 11/10/2019 and lasted until 12/23/2019. Pt stated tht her cycle was heavy with clots not pain; wearing pads and tampons changing them both every hour.

## 2020-01-02 NOTE — Progress Notes (Signed)
GYNECOLOGY CLINIC PROGRESS NOTE Subjective:     Gabriela Norris is a 70 [id number] female who presents for complaints of irregular menses and abnormal uterine bleeding. She does have a history of uterine fibroids. She states that up until August 2020 she was having regular cycles but was also beginning to note some issues with hot flushes.  After this time, she did not have another cycle until the end of February this year nor did she experience any vasomotor symptoms.  With this cycle, she states that she bled for approximately 2 weeks.  Then she had a nother cycle at the end of March, which has continued even until now, with daily bleeding, passing clots, and now re-experiencing the hot flushes.     Of note, patient was last seen at [facility] in April 2017.  Notes that she has been living in [place] until her last child finished school. Still returned to [facility] every few months to see her PCP.  Has now since returned to [facility] full time, and knows that she is due for a wellness check. Would also like to have bloodwork performed today (has a list of desired labs).    Gynecologic History Patient's last menstrual period was 11/10/2019. Contraception: none Last Pap: 12/04/2015. Results were: normal. Denies h/o abnormal pap smears.  Last mammogram: Patient had [facility] ultrasound breast screening in 07/2019. Results were: abnormal - lobulated hypoechoic area at 12:00, right breast, measuring ~ 2.2 cm.  Recommend mammogram and consult physician.   Obstetric History OB History  Gravida Para Term Preterm AB Living  _0 SAB TAB Ectopic Multiple Live Births    1     5    # Outcome Date GA Lbr Len/2nd Weight Sex Delivery Anes PTL Lv  7 Gravida      Vag-Spont   LIV  6 Gravida      Vag-Spont   LIV  5 Gravida      CS-Unspec   LIV  4 Gravida      CS-Unspec   LIV  3 Gravida      CS-Unspec   LIV  2 TAB              Birth Comments: Molar Pregnancy   1 AB              Past  Medical History:  Diagnosis Date  . ADD (attention deficit disorder)     Past Surgical History:  Procedure Laterality Date  . CESAREAN SECTION    . KNEE SURGERY      Family History  Problem Relation Age of Onset  . Breast cancer Maternal Aunt     Social History   Socioeconomic History  . Marital status: Married    Spouse name: Not on file  . Number of children: Not on file  . Years of education: Not on file  . Highest education level: Not on file  Occupational History  . Not on file  Tobacco Use  . Smoking status: Never Smoker  Substance and Sexual Activity  . Alcohol use: No  . Drug use: No  . Sexual activity: Yes    Birth control/protection: None, Surgical    Comment: Partner had vasectomy   Other Topics Concern  . Not on file  Social History Narrative  . Not on file   Social Determinants of Health   Financial Resource Strain:   . Difficulty of Paying Living Expenses:   Food  Insecurity:   . Worried About Charity fundraiser in the Last Year:   . Arboriculturist in the Last Year:   Transportation Needs:   . Film/video editor (Medical):   Marland Kitchen Lack of Transportation (Non-Medical):   Physical Activity:   . Days of Exercise per Week:   . Minutes of Exercise per Session:   Stress:   . Feeling of Stress :   Social Connections:   . Frequency of Communication with Friends and Family:   . Frequency of Social Gatherings with Friends and Family:   . Attends Religious Services:   . Active Member of Clubs or Organizations:   . Attends Archivist Meetings:   Marland Kitchen Marital Status:   Intimate Partner Violence:   . Fear of Current or Ex-Partner:   . Emotionally Abused:   Marland Kitchen Physically Abused:   . Sexually Abused:     Current Outpatient Medications on File Prior to Visit  Medication Sig Dispense Refill  . Cholecalciferol (VITAMIN D-3 PO) Take by mouth.    . Cyanocobalamin (RA VITAMIN B-12 TR) 1000 MCG TBCR Take by mouth.    . Magnesium 500 MG TABS Take  by mouth.    . Menaquinone-7 (VITAMIN K2 PO) Take by mouth.    Marland Kitchen VYVANSE 70 MG capsule     . diclofenac sodium (VOLTAREN) 1 % GEL     . ondansetron (ZOFRAN ODT) 4 MG disintegrating tablet Take 1 tablet (4 mg total) by mouth every 8 (eight) hours as needed for nausea or vomiting. (Patient not taking: Reported on 01/02/2020) 20 tablet 0   No current facility-administered medications on file prior to visit.    Allergies  Allergen Reactions  . Oxycodone-Acetaminophen Nausea And Vomiting    Review of Systems Constitutional: negative for chills, fevers and sweats. Positive for fatigue Eyes: negative for irritation, redness and visual disturbance Ears, nose, mouth, throat, and face: negative for hearing loss, nasal congestion, snoring and tinnitus Respiratory: negative for asthma, cough, sputum Cardiovascular: negative for chest pain, dyspnea, exertional chest pressure/discomfort, irregular heart beat, palpitations and syncope Gastrointestinal: negative for abdominal pain, change in bowel habits, nausea and vomiting Genitourinary: positive for abnormal menstrual periods (see HPI above).  Negative for genital lesions, sexual problems and vaginal discharge, dysuria and urinary incontinence Integument/breast: negative for breast lump, breast tenderness and nipple discharge. Does report nodularity of breast bilaterally (has a h/o fibrocystic changes).  Hematologic/lymphatic: negative for bleeding and easy bruising Musculoskeletal:negative for back pain and muscle weakness Neurological: negative for dizziness, headaches, vertigo and weakness Endocrine: negative for diabetic symptoms including polydipsia, polyuria and skin dryness. Positive for cold intolerance (especially of hands and feet) and hair loss.  Allergic/Immunologic: negative for hay fever and urticaria     Objective:    BP 133/84   Pulse 80   Ht _0  (1.626 m)   Wt 206 lb 9.6 oz (93.7 kg)   LMP 11/10/2019   BMI 35.46 kg/m    General appearance: alert and no distress Neck: no adenopathy, no carotid bruit, no JVD, supple, symmetrical, trachea midline and thyroid not enlarged, symmetric, no tenderness/mass/nodules Lungs: clear to auscultation bilaterally Breasts: normal appearance, no masses or tenderness, mild fibrocystic changes noted bilaterally Heart: regular rate and rhythm, S1, S2 normal, no murmur, click, rub or gallop Abdomen: soft, non-tender; bowel sounds normal; no masses,  no organomegaly Pelvic: external genitalia normal, rectovaginal septum normal.  Vagina without discharge.  Cervix normal appearing, no lesions and no motion tenderness.  Uterus mobile, nontender, normal shape and size.  Adnexae non-palpable, nontender bilaterally.  Extremities: extremities normal, atraumatic, no cyanosis or edema Skin: Skin color, texture, turgor normal. No rashes or lesions    Assessment:   1. Abnormal perimenopausal bleeding   2. History of uterine fibroid   3. Other fatigue   4. Cold intolerance of hand   5. History of vitamin D deficiency   6. Cervical cancer screening   7. Screening for lipid disorders   8. Hot flushes, perimenopausal   9. Abnormal ultrasound of breast   10. Increased BMI    Plan:   1. Based on symptoms and age, patient is likely perimenopausal with irregular abnormal bleeding. Discussed options for management, including: tranexamic acid (Lysteda), oral HRT, bio-identical hormones, Levonogestrel IUD, herbal remedies, endometrial ablation or hysterectomy as definitive surgical management.  Discussed risks and benefits of each method.   Patient desires to try hormonal therapy with bioidentical hormones.  Given samples of Bijuva. If no improvement, patient may need further evaluation. .  Printed patient education handouts were given to the patient to review at home. Hormone labs ordered (FSH/LH, progesterone and estradiol).  2. H/o uterine fibroids (reports 1 the size of a golf ball on scan  performed several years ago), non-palpable on toaday's exam. If bleeding does not respond to Bijuva, can proceed with workup of AUB and order ultrasound and possible endometrial biopsy.  3. Miscellaneous symptoms (fatigue, hair loss, cold intolerance). Patient desires Vitamin levels, iron panel, Thyroid panel ordered. Will also check CBC, CMP, ESR, CRP, Lipid panel.  4. Pap smear performed today for cervical cancer screening. Continue routine screening.  5. History of Vitamin D deficiency - will check levels.  6. Abnormal breast ultrasound performed by [facility]), recommended mammogram. .  Will order. 7. States that she desires to lose weight, is working on diet and exercising.  8. Patient to f/u in 2 weeks after completion of the samples. Can review any labs if abnormal.     Rubie Maid, MD [facility]

## 2020-01-02 NOTE — Patient Instructions (Signed)

## 2020-01-05 ENCOUNTER — Encounter: Payer: Self-pay | Admitting: Obstetrics and Gynecology

## 2020-01-05 LAB — COMPREHENSIVE METABOLIC PANEL
ALT: 34 IU/L — ABNORMAL HIGH (ref 0–32)
AST: 25 IU/L (ref 0–40)
Albumin/Globulin Ratio: 1.8 (ref 1.2–2.2)
Albumin: 4.6 g/dL (ref 3.8–4.9)
Alkaline Phosphatase: 103 IU/L (ref 48–121)
BUN/Creatinine Ratio: 26 — ABNORMAL HIGH (ref 9–23)
BUN: 21 mg/dL (ref 6–24)
Bilirubin Total: 0.4 mg/dL (ref 0.0–1.2)
CO2: 25 mmol/L (ref 20–29)
Calcium: 9.6 mg/dL (ref 8.7–10.2)
Chloride: 104 mmol/L (ref 96–106)
Creatinine, Ser: 0.8 mg/dL (ref 0.57–1.00)
GFR calc Af Amer: 97 mL/min/{1.73_m2} (ref >59)
GFR calc non Af Amer: 84 mL/min/{1.73_m2} (ref >59)
Globulin, Total: 2.6 g/dL (ref 1.5–4.5)
Glucose: 103 mg/dL — ABNORMAL HIGH (ref 65–99)
Potassium: 4.5 mmol/L (ref 3.5–5.2)
Sodium: 142 mmol/L (ref 134–144)
Total Protein: 7.2 g/dL (ref 6.0–8.5)

## 2020-01-05 LAB — CBC
Hematocrit: 42.9 % (ref 34.0–46.6)
Hemoglobin: 14.5 g/dL (ref 11.1–15.9)
MCH: 30.3 pg (ref 26.6–33.0)
MCHC: 33.8 g/dL (ref 31.5–35.7)
MCV: 90 fL (ref 79–97)
Platelets: 293 10*3/uL (ref 150–450)
RBC: 4.78 x10E6/uL (ref 3.77–5.28)
RDW: 12.9 % (ref 11.7–15.4)
WBC: 6.8 10*3/uL (ref 3.4–10.8)

## 2020-01-05 LAB — IRON,TIBC AND FERRITIN PANEL
Ferritin: 29 ng/mL (ref 15–150)
Iron Saturation: 19 % (ref 15–55)
Iron: 74 ug/dL (ref 27–159)
Total Iron Binding Capacity: 383 ug/dL (ref 250–450)
UIBC: 309 ug/dL (ref 131–425)

## 2020-01-05 LAB — VITAMIN D 25 HYDROXY (VIT D DEFICIENCY, FRACTURES): Vit D, 25-Hydroxy: 49.2 ng/mL (ref 30.0–100.0)

## 2020-01-05 LAB — B12 AND FOLATE PANEL
Folate: 11.8 ng/mL (ref >3.0)
Vitamin B-12: 581 pg/mL (ref 232–1245)

## 2020-01-05 LAB — SEDIMENTATION RATE: Sed Rate: 7 mm/hr (ref 0–40)

## 2020-01-05 LAB — THYROGLOBULIN ANTIBODY: Thyroglobulin Antibody: <1 IU/mL (ref 0.0–0.9)

## 2020-01-05 LAB — THYROID PANEL WITH TSH
Free Thyroxine Index: 1.9 (ref 1.2–4.9)
T3 Uptake Ratio: 26 % (ref 24–39)
T4, Total: 7.3 ug/dL (ref 4.5–12.0)
TSH: 3.59 u[IU]/mL (ref 0.450–4.500)

## 2020-01-05 LAB — FSH/LH
FSH: 62.5 m[IU]/mL
LH: 37.5 m[IU]/mL

## 2020-01-05 LAB — PROGESTERONE: Progesterone: 0.1 ng/mL

## 2020-01-05 LAB — T3, REVERSE: Reverse T3, Serum: 13.8 ng/dL (ref 9.2–24.1)

## 2020-01-05 LAB — THYROID PEROXIDASE ANTIBODY: Thyroperoxidase Ab SerPl-aCnc: 18 IU/mL (ref 0–34)

## 2020-01-05 LAB — HEMOGLOBIN A1C
Est. average glucose Bld gHb Est-mCnc: 123 mg/dL
Hgb A1c MFr Bld: 5.9 % — ABNORMAL HIGH (ref 4.8–5.6)

## 2020-01-05 LAB — ZINC: Zinc: 138 ug/dL — ABNORMAL HIGH (ref 44–115)

## 2020-01-05 LAB — ESTRADIOL: Estradiol: 20.9 pg/mL

## 2020-01-05 LAB — MAGNESIUM: Magnesium: 2.3 mg/dL (ref 1.6–2.3)

## 2020-01-08 LAB — CYTOLOGY - PAP
Comment: NEGATIVE
Diagnosis: NEGATIVE
High risk HPV: NEGATIVE

## 2020-02-06 ENCOUNTER — Encounter: Payer: 59 | Admitting: Obstetrics and Gynecology

## 2020-02-15 ENCOUNTER — Encounter: Payer: Self-pay | Admitting: Obstetrics and Gynecology

## 2021-05-12 ENCOUNTER — Encounter: Payer: Self-pay | Admitting: Obstetrics and Gynecology

## 2021-06-12 ENCOUNTER — Encounter: Payer: PRIVATE HEALTH INSURANCE | Admitting: Obstetrics and Gynecology
